# Patient Record
Sex: Female | Born: 1965 | Race: White | Hispanic: No | Marital: Married | Smoking: Never smoker
Health system: Southern US, Community
[De-identification: ages and names within clinical notes are randomized; demographics above are authoritative.]

## PROBLEM LIST (undated history)

## (undated) DIAGNOSIS — IMO0002 Reserved for concepts with insufficient information to code with codable children: Secondary | ICD-10-CM

## (undated) DIAGNOSIS — F32A Depression, unspecified: Secondary | ICD-10-CM

## (undated) DIAGNOSIS — F329 Major depressive disorder, single episode, unspecified: Secondary | ICD-10-CM

## (undated) HISTORY — DX: Reserved for concepts with insufficient information to code with codable children: IMO0002

## (undated) HISTORY — DX: Depression, unspecified: F32.A

## (undated) HISTORY — PX: CHOLECYSTECTOMY: SHX55

## (undated) HISTORY — DX: Major depressive disorder, single episode, unspecified: F32.9

## (undated) HISTORY — PX: AUGMENTATION MAMMAPLASTY: SUR837

## (undated) HISTORY — PX: BREAST ENHANCEMENT SURGERY: SHX7

## (undated) HISTORY — PX: HYSTEROSCOPY: SHX211

---

## 1999-04-10 ENCOUNTER — Other Ambulatory Visit: Admission: RE | Admit: 1999-04-10 | Discharge: 1999-04-10 | Payer: Self-pay | Admitting: *Deleted

## 2000-05-22 ENCOUNTER — Other Ambulatory Visit: Admission: RE | Admit: 2000-05-22 | Discharge: 2000-05-22 | Payer: Self-pay | Admitting: *Deleted

## 2002-08-05 ENCOUNTER — Other Ambulatory Visit: Admission: RE | Admit: 2002-08-05 | Discharge: 2002-08-05 | Payer: Self-pay | Admitting: Obstetrics and Gynecology

## 2003-12-29 ENCOUNTER — Other Ambulatory Visit: Admission: RE | Admit: 2003-12-29 | Discharge: 2003-12-29 | Payer: Self-pay | Admitting: Obstetrics and Gynecology

## 2008-03-24 HISTORY — PX: LAPAROSCOPIC OVARIAN CYSTECTOMY: SUR786

## 2008-04-13 ENCOUNTER — Ambulatory Visit (HOSPITAL_COMMUNITY): Admission: RE | Admit: 2008-04-13 | Discharge: 2008-04-13 | Payer: Self-pay | Admitting: Obstetrics and Gynecology

## 2008-04-13 ENCOUNTER — Encounter (INDEPENDENT_AMBULATORY_CARE_PROVIDER_SITE_OTHER): Payer: Self-pay | Admitting: Obstetrics and Gynecology

## 2008-05-24 ENCOUNTER — Encounter: Admission: RE | Admit: 2008-05-24 | Discharge: 2008-05-24 | Payer: Self-pay | Admitting: Obstetrics and Gynecology

## 2008-09-17 ENCOUNTER — Emergency Department (HOSPITAL_BASED_OUTPATIENT_CLINIC_OR_DEPARTMENT_OTHER): Admission: EM | Admit: 2008-09-17 | Discharge: 2008-09-17 | Payer: Self-pay | Admitting: Emergency Medicine

## 2008-09-19 ENCOUNTER — Ambulatory Visit: Payer: Self-pay | Admitting: Diagnostic Radiology

## 2008-09-19 ENCOUNTER — Ambulatory Visit (HOSPITAL_BASED_OUTPATIENT_CLINIC_OR_DEPARTMENT_OTHER): Admission: RE | Admit: 2008-09-19 | Discharge: 2008-09-19 | Payer: Self-pay | Admitting: Emergency Medicine

## 2009-04-11 ENCOUNTER — Ambulatory Visit: Payer: Self-pay | Admitting: *Deleted

## 2009-04-17 ENCOUNTER — Ambulatory Visit: Payer: Self-pay | Admitting: Family

## 2009-04-17 ENCOUNTER — Emergency Department (HOSPITAL_COMMUNITY): Admission: EM | Admit: 2009-04-17 | Discharge: 2009-04-17 | Payer: Self-pay | Admitting: Emergency Medicine

## 2009-04-17 ENCOUNTER — Observation Stay (HOSPITAL_COMMUNITY): Admission: AD | Admit: 2009-04-17 | Discharge: 2009-04-18 | Payer: Self-pay | Admitting: Psychiatry

## 2009-04-17 ENCOUNTER — Ambulatory Visit: Payer: Self-pay | Admitting: Psychiatry

## 2009-04-17 DIAGNOSIS — F329 Major depressive disorder, single episode, unspecified: Secondary | ICD-10-CM | POA: Insufficient documentation

## 2009-05-02 ENCOUNTER — Ambulatory Visit: Payer: Self-pay | Admitting: *Deleted

## 2009-06-05 ENCOUNTER — Telehealth: Payer: Self-pay | Admitting: Family

## 2009-06-15 ENCOUNTER — Ambulatory Visit: Payer: Self-pay | Admitting: *Deleted

## 2009-06-22 ENCOUNTER — Ambulatory Visit: Payer: Self-pay | Admitting: *Deleted

## 2009-07-11 ENCOUNTER — Ambulatory Visit: Payer: Self-pay | Admitting: *Deleted

## 2009-07-20 ENCOUNTER — Ambulatory Visit: Payer: Self-pay | Admitting: *Deleted

## 2009-08-28 ENCOUNTER — Encounter: Admission: RE | Admit: 2009-08-28 | Discharge: 2009-08-28 | Payer: Self-pay | Admitting: Obstetrics and Gynecology

## 2010-03-03 IMAGING — US US ABDOMEN COMPLETE
1 series · 14 of 25 positions shown · non-contrast
Comparison: None

CLINICAL DATA: Nausea, vomiting, diarrhea for a week

COMPLETE ABDOMINAL ULTRASOUND

[Series 1: us abdomen complete · 0.32mm/px · 14 of 77 slices shown]
[im 1/77]
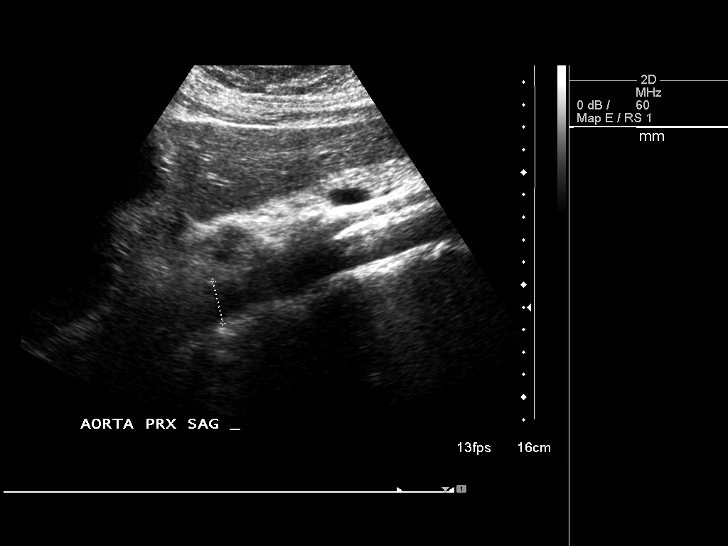
[im 7/77]
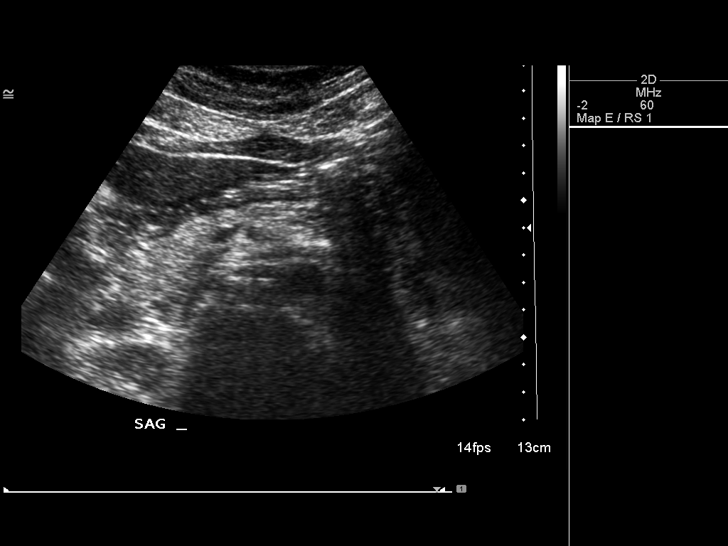
[im 13/77]
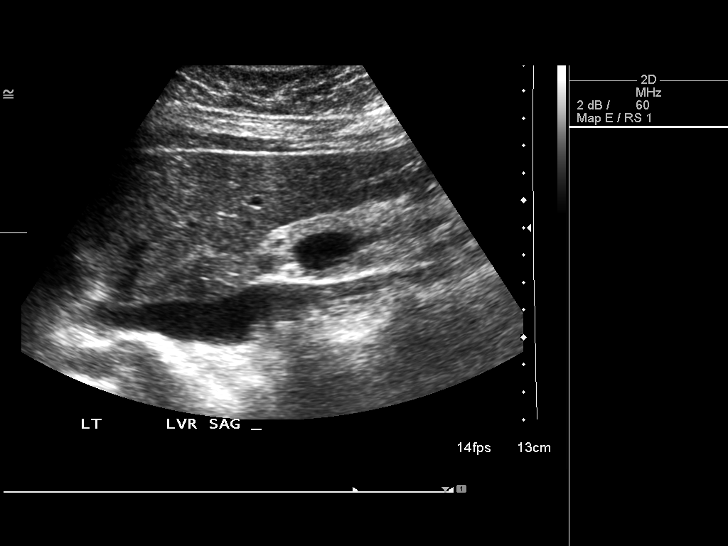
[im 20/77]
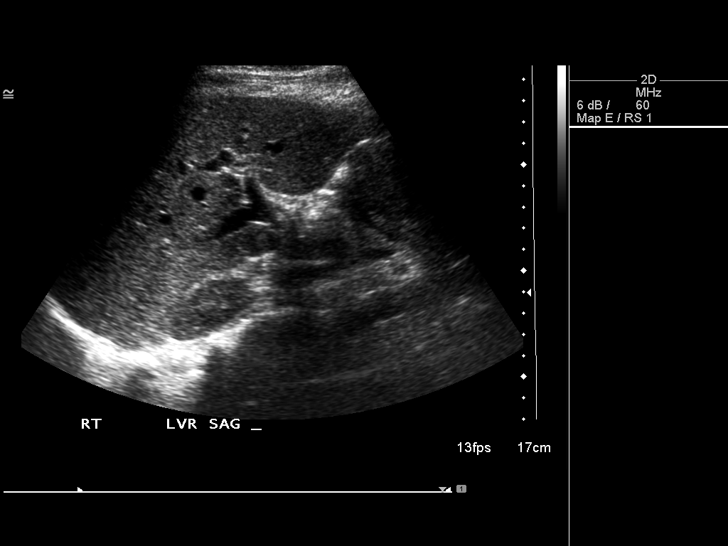
[im 26/77]
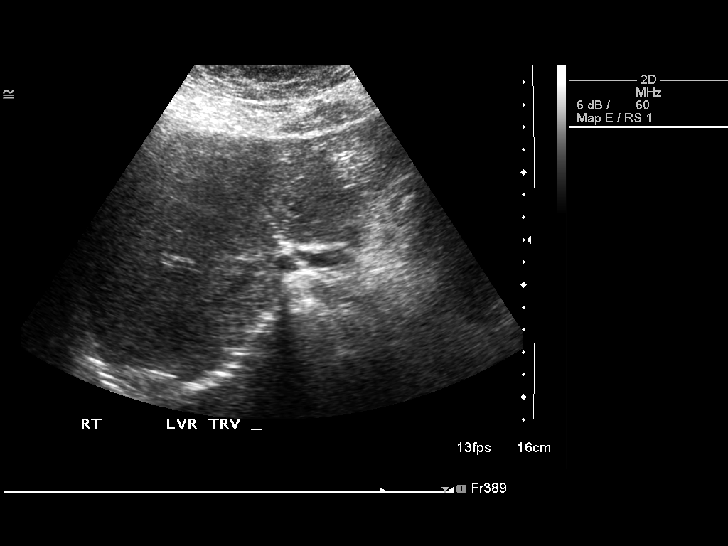
[im 29/77]
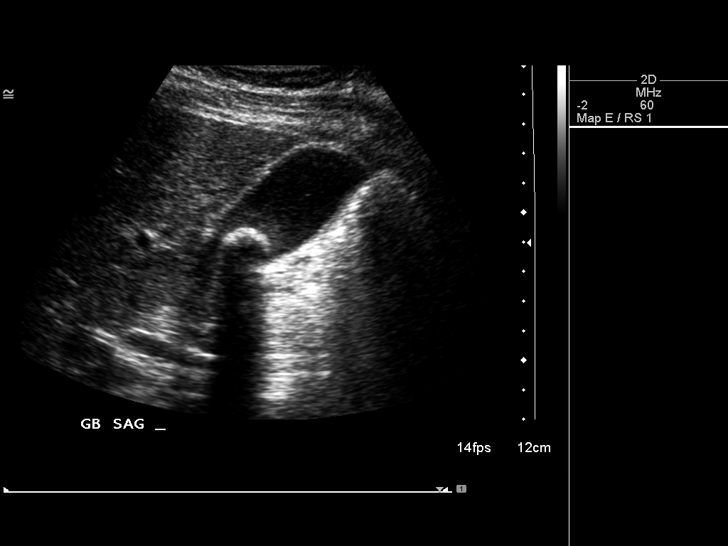
[im 35/77]
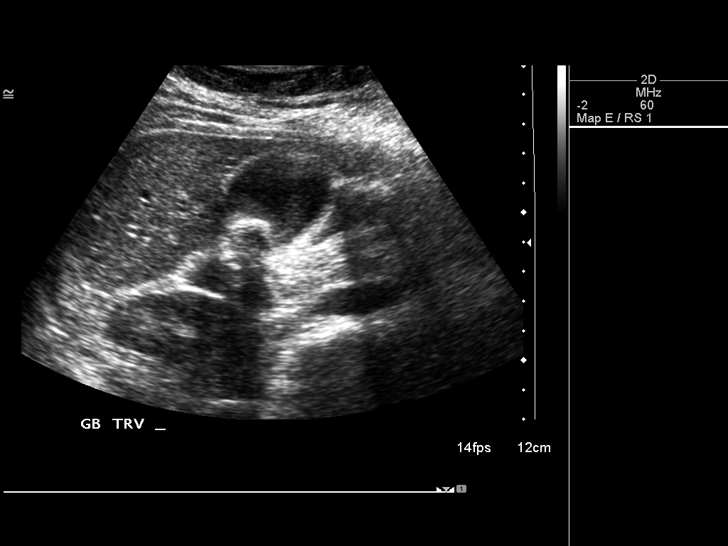
[im 42/77]
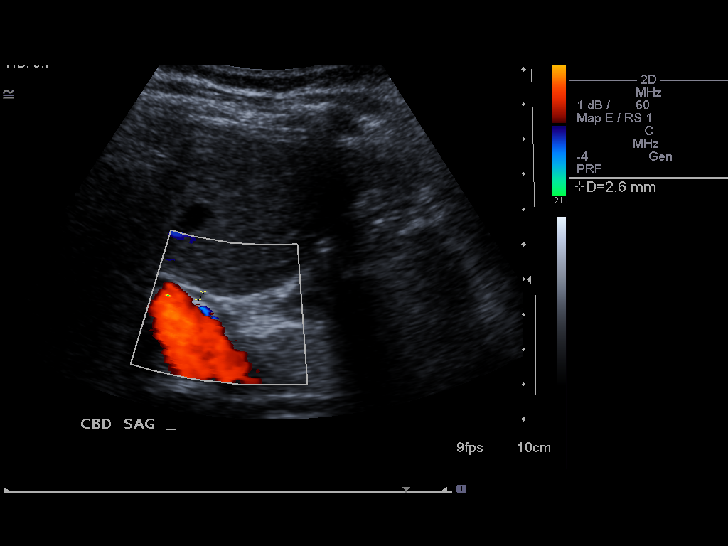
[im 48/77]
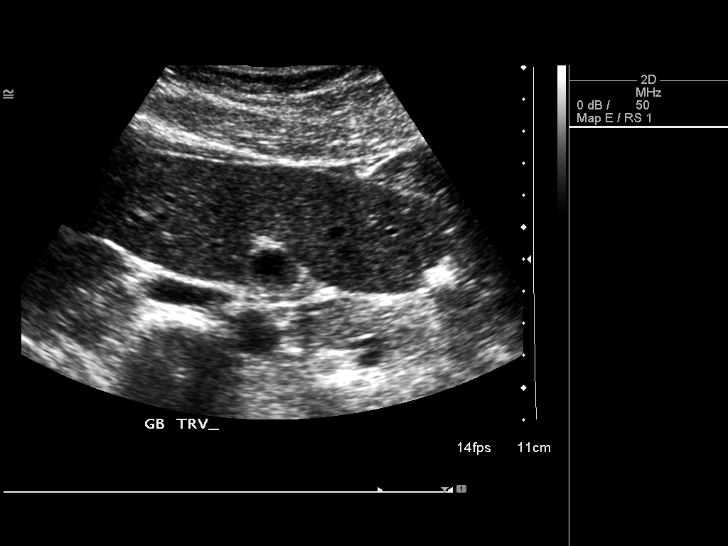
[im 51/77]
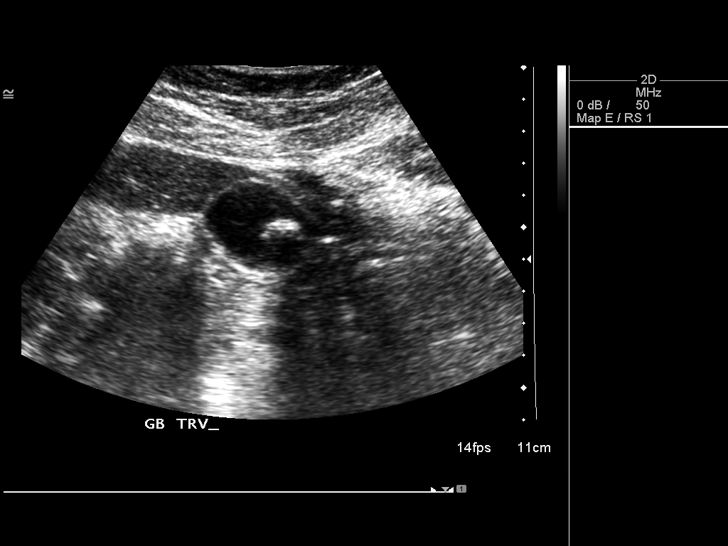
[im 58/77]
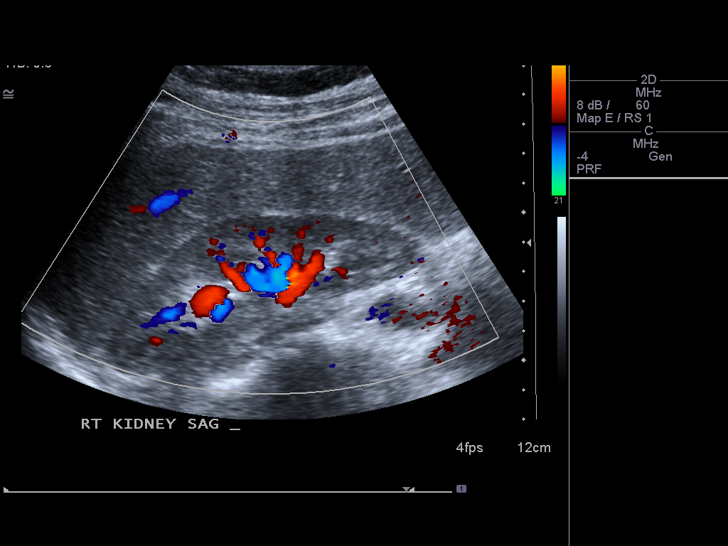
[im 64/77]
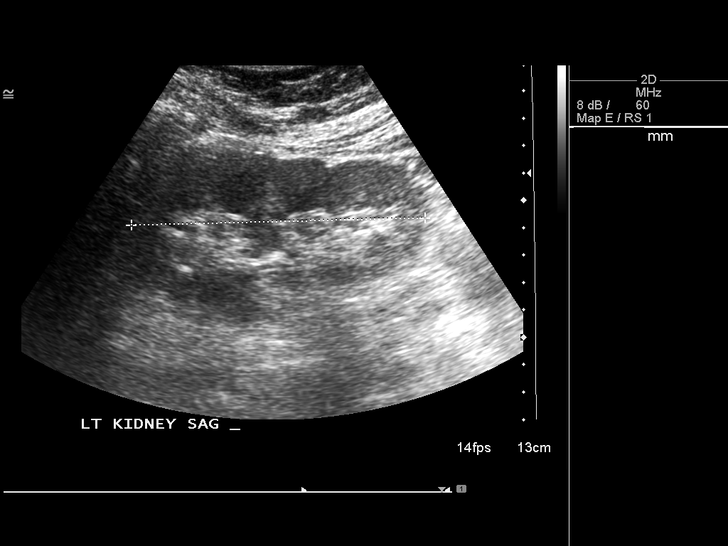
[im 70/77]
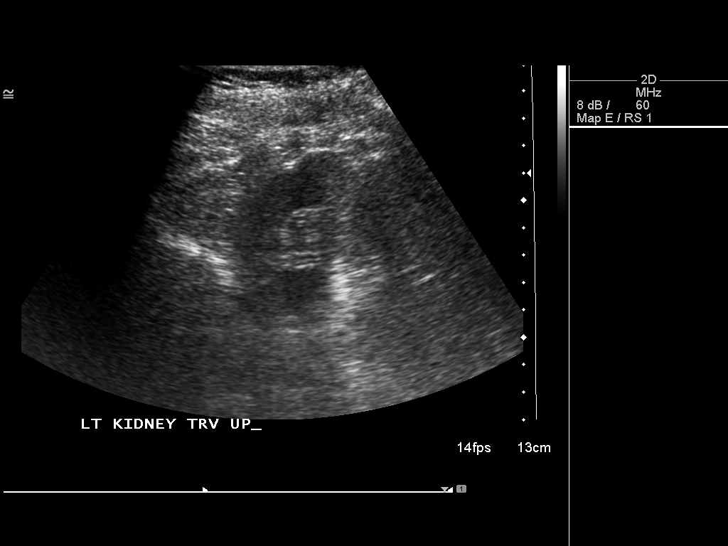
[im 77/77]
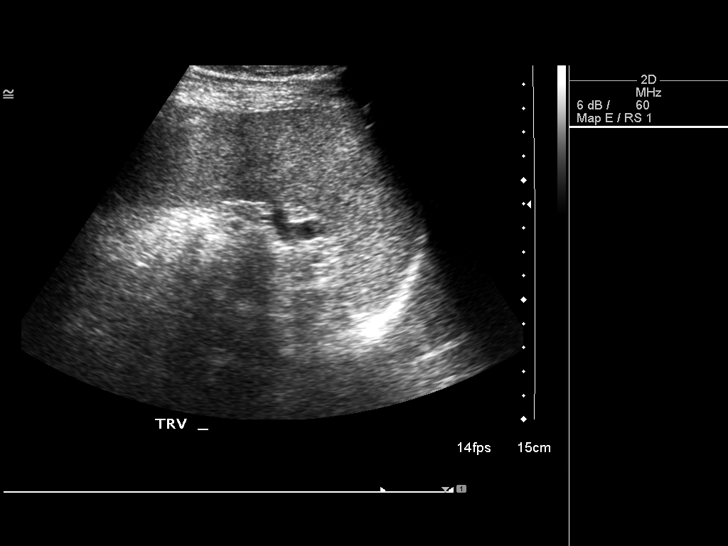

[14 of 25 positions shown; findings below may reference images not displayed]

FINDINGS: Gallbladder:  There is at least a single gallstone of 15 mm in
diameter with strong shadowing.  Some gallbladder sludge is
present.  No gallbladder wall thickening is noted and there is no
pain over the gallbladder upon compression.

Common bile duct:  The common bile duct is normal measuring 2.6 mm
in diameter.

Liver:  The liver has a normal echogenic pattern.  No ductal
dilatation is seen.

IVC:  Obscured by bowel gas.

Pancreas:  The pancreas is largely obscured by bowel gas.

Spleen:  The spleen is within upper limits of normal measuring
cm sagittally.

Right Kidney:  No hydronephrosis.  The right kidney measures
cm sagittally.

Left Kidney:  No hydronephrosis.  The left kidney measures 10.7 cm.

Abdominal aorta:  The aorta is largely obscured by bowel gas.
IMPRESSION: 1.  15 mm gallstone with some sludge.  No gallbladder wall
thickening or pain over the gallbladder.
2.  No ductal dilatation.
3.  Bowel gas obscures the pancreas and abdominal aorta.

## 2010-04-23 NOTE — Progress Notes (Signed)
  Phone Note Call from Patient   Caller: Mom Carollee Massed Details for Reason: Needs medication call in Summary of Call: Pt was in [facility]  in Jan 2011  given Zoloft 50 mg  wants  to know if you can call in rx     [facility]     Carollee Massed   [phone]  Initial call taken by: Darral Dash,  June 05, 2009 1:00 PM  Follow-up for Phone Call        I need to see patient back before I prescribe any medicines.  Pls call patient and arrange follow up.  Also, pls ask her if she was referred to an outpatient psychiatrist by behavioral health.   Follow-up by: Lemont Fillers FNP,  June 05, 2009 1:44 PM  Additional Follow-up for Phone Call Additional follow up Details #1::        Returned call to pt's mother, Kathie Rhodes. Advised her she would need appt. before we could fill any prescriptions.  Kathie Rhodes states that she will let pt. know. She states that pt. has appt to see Dr. Orland Jarred for medication management.  They will call back to schedule appt. if Dr. Orland Jarred cannot fill prescription. Additional Follow-up by: Mervin Kung CMA,  June 05, 2009 2:06 PM    Additional Follow-up for Phone Call Additional follow up Details #2:: Follow-up by: Lemont Fillers FNP,  June 05, 2009 3:48 PM

## 2010-04-23 NOTE — Letter (Signed)
Summary: Call a Nurse  Call a Nurse   Imported By: Lanelle Bal 04/25/2009 10:10:41  _____________________________________________________________________  External Attachment:    Type:   Image     Comment:   External Document

## 2010-04-23 NOTE — Assessment & Plan Note (Signed)
Summary: NEW PT/[facility]/NS/[facility]   Vital Signs:  Patient profile:   45 year old female Height:      67 inches Weight:      134.13 pounds BMI:     21.08 Pulse rate:   64 / minute BP sitting:   100 / 80  Vitals Entered By: Kandice Hams (April 17, 2009 8:55 AM) CC: new pt discuss insomnia and depression, want med   CC:  new pt discuss insomnia and depression and want med.  History of Present Illness: [name] is a  45 year old female who presents today to establish care.  She recently saw [name] due to depression.  Notes difficulty sleeping, eating, + tearfulness, crying spells.  Spends much of her time in the bed.  Notes that she lost her job March 2010 and  has been actively looking for a new job.  Notes that she and her husband are discussing separation.  She has 1 step son.  Patient has not taken any medicines for depression.  Mother who accompanies patient tells me that there is no family history of bipolar disorder and that patient has no former history of depression.    Preventive Screening-Counseling & Management  Alcohol-Tobacco     Alcohol drinks/day: 0     Smoking Status: never  Caffeine-Diet-Exercise     Caffeine use/day: no  Safety-Violence-Falls     Seat Belt Use: yes     Helmet Use: no     Firearms in the Home: firearms in the home      Drug Use:  no.    Allergies (verified): 1)  ! Codeine  Past History:  Past Surgical History: fibroid tumor removed 1/10 breast augmentation gallbladder 2010  Family History: Father: diabetes Mother:  Siblings:  Family History Diabetes 1st degree relative dad deceased pancreatic cancer  Social History: Marital Status: Married Children:  Occupation: unemployed Married Never Smoked Alcohol use-no Drug use-no Smoking Status:  never Caffeine use/day:  no Seat Belt Use:  yes Drug Use:  no  Review of Systems       Notes that she has + suicide ideation on a daily basis.  Notes that she has considered plan of  "taking a bunch of pills".  Also has occasional panic attacks.  Notes 25 pound weight loss since July 2010, + anorexia, +insomnia  Physical Exam  General:  Well-developed,well-nourished,in no acute distress; alert,appropriate and cooperative throughout examination Head:  Normocephalic and atraumatic without obvious abnormalities. No apparent alopecia or balding. Lungs:  Normal respiratory effort, chest expands symmetrically. Lungs are clear to auscultation, no crackles or wheezes. Heart:  Normal rate and regular rhythm. S1 and S2 normal without gallop, murmur, click, rub or other extra sounds. Abdomen:  Bowel sounds positive,abdomen soft and non-tender without masses, organomegaly or hernias noted. Psych:  Tearful, depressed affect.      Impression & Recommendations:  Problem # 1:  DEPRESSION, MAJOR (ICD-296.20) Assessment Deteriorated Patient with active suicide ideation.  Will send to [facility] ER for further evaluation and hopefully inpatient psychiatric evaluation.  I discussed plan with patient and mother.  They agree with plan- mother to take patient over to [facility].  Will plan a complete physical when patient is feeling better.  Patient Instructions: 1)  Please go directly to the [facility] Emergency Room. 2)  Arrange a follow up visit for a complete physical after your are discharged from the hospital.

## 2010-06-10 LAB — CBC
Hemoglobin: 11.5 g/dL — ABNORMAL LOW (ref 12.0–15.0)
MCHC: 33.8 g/dL (ref 30.0–36.0)
WBC: 3.7 10*3/uL — ABNORMAL LOW (ref 4.0–10.5)

## 2010-06-10 LAB — URINE MICROSCOPIC-ADD ON

## 2010-06-10 LAB — BASIC METABOLIC PANEL
GFR calc Af Amer: >60 mL/min (ref >60)
Potassium: 3.2 mEq/L — ABNORMAL LOW (ref 3.5–5.1)
Sodium: 136 mEq/L (ref 135–145)

## 2010-06-10 LAB — DIFFERENTIAL
Basophils Absolute: 0 10*3/uL (ref 0.0–0.1)
Eosinophils Absolute: 0 10*3/uL (ref 0.0–0.7)
Lymphocytes Relative: 30 % (ref 12–46)
Monocytes Relative: 9 % (ref 3–12)
Neutro Abs: 2.2 10*3/uL (ref 1.7–7.7)
Neutrophils Relative %: 59 % (ref 43–77)

## 2010-06-10 LAB — URINALYSIS, ROUTINE W REFLEX MICROSCOPIC
Nitrite: POSITIVE — AB
Protein, ur: >300 mg/dL — AB
Specific Gravity, Urine: 1.028 (ref 1.005–1.030)
Urobilinogen, UA: 1 mg/dL (ref 0.0–1.0)
pH: 5 (ref 5.0–8.0)

## 2010-06-10 LAB — RAPID URINE DRUG SCREEN, HOSP PERFORMED
Cocaine: NOT DETECTED
Opiates: NOT DETECTED
Tetrahydrocannabinol: NOT DETECTED

## 2010-07-01 LAB — DIFFERENTIAL
Basophils Absolute: 0 10*3/uL (ref 0.0–0.1)
Basophils Relative: 1 % (ref 0–1)
Eosinophils Relative: 2 % (ref 0–5)
Monocytes Absolute: 0.5 10*3/uL (ref 0.1–1.0)
Monocytes Relative: 10 % (ref 3–12)
Neutro Abs: 3.7 10*3/uL (ref 1.7–7.7)
Neutrophils Relative %: 66 % (ref 43–77)

## 2010-07-01 LAB — CBC
MCV: 83 fL (ref 78.0–100.0)
Platelets: 169 10*3/uL (ref 150–400)
WBC: 5.5 10*3/uL (ref 4.0–10.5)

## 2010-07-01 LAB — COMPREHENSIVE METABOLIC PANEL
ALT: 125 U/L — ABNORMAL HIGH (ref 0–35)
AST: 88 U/L — ABNORMAL HIGH (ref 0–37)
Alkaline Phosphatase: 94 U/L (ref 39–117)
Creatinine, Ser: 0.7 mg/dL (ref 0.4–1.2)
GFR calc Af Amer: >60 mL/min (ref >60)
GFR calc non Af Amer: >60 mL/min (ref >60)
Glucose, Bld: 82 mg/dL (ref 70–99)
Potassium: 3.4 mEq/L — ABNORMAL LOW (ref 3.5–5.1)
Total Protein: 7.1 g/dL (ref 6.0–8.3)

## 2010-07-01 LAB — URINALYSIS, ROUTINE W REFLEX MICROSCOPIC
Bilirubin Urine: NEGATIVE
Leukocytes, UA: NEGATIVE
Nitrite: NEGATIVE
Protein, ur: NEGATIVE mg/dL
pH: 6 (ref 5.0–8.0)

## 2010-07-01 LAB — URINE MICROSCOPIC-ADD ON

## 2010-07-01 LAB — LIPASE, BLOOD: Lipase: 65 U/L (ref 23–300)

## 2010-07-08 LAB — COMPREHENSIVE METABOLIC PANEL
Albumin: 4 g/dL (ref 3.5–5.2)
Alkaline Phosphatase: 77 U/L (ref 39–117)
CO2: 25 mEq/L (ref 19–32)
Chloride: 100 mEq/L (ref 96–112)
Creatinine, Ser: 0.62 mg/dL (ref 0.4–1.2)
Glucose, Bld: 80 mg/dL (ref 70–99)
Total Bilirubin: 0.4 mg/dL (ref 0.3–1.2)

## 2010-07-08 LAB — CA 125: CA 125: 4.3 U/mL (ref 0.0–30.2)

## 2010-07-08 LAB — CBC
HCT: 39 % (ref 36.0–46.0)
Hemoglobin: 13.2 g/dL (ref 12.0–15.0)
MCHC: 33.7 g/dL (ref 30.0–36.0)
WBC: 5.4 10*3/uL (ref 4.0–10.5)

## 2010-07-08 LAB — PREGNANCY, URINE: Preg Test, Ur: NEGATIVE

## 2010-07-24 ENCOUNTER — Other Ambulatory Visit: Payer: Self-pay | Admitting: Obstetrics and Gynecology

## 2010-07-24 DIAGNOSIS — Z1231 Encounter for screening mammogram for malignant neoplasm of breast: Secondary | ICD-10-CM

## 2010-08-06 NOTE — Op Note (Signed)
NAME:  Megan Whitaker, Megan Whitaker               ACCOUNT NO.:  [id number]   MEDICAL RECORD NO.:  [id number]          PATIENT TYPE:  AMB   LOCATION:  SDC                           FACILITY:  [facility]   PHYSICIAN:  Crist Fat. Rivard, M.D. DATE OF BIRTH:  [birth year]   DATE OF PROCEDURE:  DATE OF DISCHARGE:                               OPERATIVE REPORT   PREOPERATIVE DIAGNOSES:  Dysfunctional uterine bleeding with endometrial  mass and left ovarian mass.   POSTOPERATIVE DIAGNOSES:  Dysfunctional uterine bleeding, endometrial  polyp, bilateral ovarian cysts, and pelvic adhesions as well as probable  endometriosis.   PROCEDURE:  Hysteroscopy with resection of endometrial polyp and D and  C, laparoscopy with bilateral ovarian cystectomy, lysis of adhesions,  and peritoneal biopsy.   SURGEON:  Crist Fat. Rivard, MD   ASSISTANT:  Elmira J. Lowell Guitar, PA   ESTIMATED BLOOD LOSS:  Minimal.   PROCEDURE:  After being informed of the planned procedure with possible  complications including bleeding, infection, injury to other organs, and  the possibility of laparotomy and oophorectomy, informed consent was  obtained.  The patient was taken to OR #3, given general anesthesia with  endotracheal intubation without any complication.  She was placed in the  lithotomy position, prepped and draped in a sterile fashion, and a Foley  catheter was inserted in her bladder.  GYN exam revealed a retroverted  uterus, normal in size and shape and mobile, and a large right adnexa  measuring approximately 4-5 cm and a normal left adnexa.  A weighted  speculum was inserted.  Anterior lip of the cervix was grasped with a  tenaculum forceps, and we proceeded with a paracervical block using  Nesacaine 1%, 20 mL in the usual fashion.  Uterus was sounded at 7 cm,  and the cervix was easily dilated using Hegar dilator until #23.  This  allowed for easy entry of a diagnostic hysteroscope.  With sorbitol  perfusion at a maximum pressure  of 90 mmHg, we were able to visualize  the entire uterine cavity with both tubal ostia.  The cavity looked  completely normal except for a 1.5-cm lower posterior wall polyp.  Diagnostic hysteroscope was removed.  The cervix was dilated until #33  which now allowed easy entry of an operative hysteroscope and a single  loop resectoscope.  We proceeded with resection of the polyp.  We  removed all instruments and proceeded with a curettage of the  endometrial cavity to remove the remainder of the endometrial lining.  An acorn manipulator was then placed to pursue our procedure with  laparoscopy.  Water deficit at this time was 30 mL.   The patient was repositioned and redraped.  We infiltrated the umbilical  area with 3 mL of Marcaine 0.25 and we performed a 5-mm incision.  Veress needle was inserted.  Its placement was confirmed, and we  proceeded with insufflation of pneumoperitoneum using CO2 at a maximum  pressure of 15 mmHg.  A 5-mm trocar was inserted, and a 5-mm laparoscope  was inserted.  We infiltrated the suprapubic area with 3 mL of Marcaine  0.25 and performed a 5-mm incision for insertion of a 5-mm trocar under  direct visualization.  We proceeded in the exact same fashion in the  right lower quadrant.   OBSERVATION:  Anterior cul-de-sac is normal.  The uterus is normal.  Posterior cul-de-sac is normal.  The right tube is normal.  Right ovary  has a 4.5-cm simple-appearing cyst with a thin wall.  Both ovaries and  tubes were mobile.  There is no adhesion, and there is no outside  nodularity.  The left tube is normal.  The left ovary has a small cyst  measuring approximately 2 cm in diameter, also thin-wall appearing  simple.  The ovarian cortex appears completely normal.  There is no  adhesion, and there is no nodularity.  In the right ovarian fossa, we  note above the ureter a bluish lesion measuring 2-3 mm compatible with  endometriosis and right overlooking the ureter.  We  see another small 1-  2 mm brownish lesion that is compatible with endometriosis.  Appendix is  visualized and normal, although the cecal appendix junction is pulled  towards the right pelvic wall with a fine grade 1 to 2 adhesion.  Liver  is visualized and normal, and gallbladder is visualized and normal.   We decided to proceed with bilateral ovarian cystectomy, and we started  with the right ovary.  The utero-ovarian ligament was grasped, and the  ovary was immobilized.  Using vasopressin 20 units and 50 mL, we  infiltrate the capsule of the cyst and we opened it sharply with hot  scissors.  Using graspers, we can now peel the ovarian cyst wall away  from the ovarian capsule.  The cyst wall was removed entirely and sent  to Pathology.  We then irrigated profusely with warm saline, and we  completed hemostasis inside the ovarian resection area with bipolar  cauterization until hemostasis was satisfactory.  We then proceeded in  the exact same fashion on the left side, grasping the utero-ovarian  ligament.  We did not infiltrate, but use only scissors to open it.  The  ovarian cyst capsule was grasped, and we could bluntly dissect it away  with traction and countertraction.  It is removed and sent to Pathology.  We irrigated profusely with warm saline and complete hemostasis on the  cortex edges using bipolar cauterization.   We now proceeded with a peritoneal biopsy above the ureter with a biopsy  forceps.  This was sent separately and the area of biopsy was  cauterized.  We were unable to address the second lesion of  endometriosis since it lies completely over the ureter.  Finally, we  addressed the fine adhesions between the cecum and the appendix and the  pelvic wall with sharp dissection until it was completely freed.  One  area of bleeding was cauterized with bipolar cauterization.  We again  irrigated profusely with warm saline and we even did an underwater view  to confirm  satisfactory hemostasis on both ovaries on peritoneal biopsy  as well as on lysis of adhesion site.   The instruments were then removed.  Pneumoperitoneum was evacuated, and  all trocars were removed.  All trocar sites were closed with Dermabond.   Instruments and sponge count was complete x2.  Estimated blood loss was  minimal.  The procedure was very well tolerated by the patient who was  taken to recovery room and will be discharged home in a well and stable  condition.   SPECIMEN:  Right ovarian cyst wall, left ovarian cyst wall, peritoneal  biopsy, endometrial polyp, endometrial curettings, all sent to  Pathology.      Crist Fat Rivard, M.D.  Electronically Signed     SAR/MEDQ  D:  04/13/2008  T:  04/14/2008  Job:  [id number]

## 2010-09-02 ENCOUNTER — Ambulatory Visit
Admission: RE | Admit: 2010-09-02 | Discharge: 2010-09-02 | Disposition: A | Discharge status: Home / Self Care | Payer: 59 | Source: Ambulatory Visit | Attending: Obstetrics and Gynecology | Admitting: Obstetrics and Gynecology

## 2010-09-02 DIAGNOSIS — Z1231 Encounter for screening mammogram for malignant neoplasm of breast: Secondary | ICD-10-CM

## 2011-07-08 ENCOUNTER — Encounter: Payer: Self-pay | Admitting: Obstetrics and Gynecology

## 2011-07-08 ENCOUNTER — Ambulatory Visit (INDEPENDENT_AMBULATORY_CARE_PROVIDER_SITE_OTHER): Payer: 59 | Admitting: Obstetrics and Gynecology

## 2011-07-08 VITALS — BP 118/68 | Ht 67.0 in | Wt 143.0 lb

## 2011-07-08 DIAGNOSIS — N938 Other specified abnormal uterine and vaginal bleeding: Secondary | ICD-10-CM

## 2011-07-08 DIAGNOSIS — Z124 Encounter for screening for malignant neoplasm of cervix: Secondary | ICD-10-CM

## 2011-07-08 MED ORDER — NORETHINDRONE ACETATE 5 MG PO TABS: 10.0000 mg | ORAL_TABLET | Freq: Every day | ORAL | Status: DC

## 2011-07-08 MED ORDER — PROGESTERONE MICRONIZED 200 MG PO CAPS: 200.0000 mg | ORAL_CAPSULE | Freq: Every day | ORAL | Status: DC

## 2011-07-08 NOTE — Progress Notes (Signed)
Subjective:    Mariselda Badalamenti is a 46 y.o. female, G1P0010, who presents for an annual exam.     History   Social History  . Marital Status: Married    Spouse Name: N/A    Number of Children: N/A  . Years of Education: N/A   Occupational History  . receptionist    Social History Main Topics  . Smoking status: Never Smoker   . Smokeless tobacco: Never Used  . Alcohol Use: No  . Drug Use: No  . Sexually Active: Yes    Birth Control/ Protection: Condom   Other Topics Concern  . None   Social History Narrative  . None    Menstrual cycle:   LMP: Patient's last menstrual period was 06/07/2011.           Cycle: irregular occurring approximately every 50-60 days without intermenstrual spotting, usually lasting 14 to 30 days with light flow and no severe dysmenorrha  The following portions of the patient's history were reviewed and updated as appropriate: allergies, current medications, past family history, past medical history, past social history, past surgical history and problem list.  Review of Systems Pertinent items are noted in HPI. Breast:Negative for breast lump,nipple discharge or nipple retraction Gastrointestinal: Negative for abdominal pain, change in bowel habits or rectal bleeding Urinary:negative   Objective:    BP 118/68  Ht 5\' 7"  (1.702 m)  Wt 143 lb (64.864 kg)  BMI 22.40 kg/m2  LMP 06/07/2011    Weight:  Wt Readings from Last 1 Encounters:  07/08/11 143 lb (64.864 kg)          BMI: Body mass index is 22.40 kg/(m^2).  General Appearance: Alert, appropriate appearance for age. No acute distress HEENT: Grossly normal Neck / Thyroid: Supple, no masses, nodes or enlargement Lungs: clear to auscultation bilaterally Back: No CVA tenderness Breast Exam: No masses or nodes.No dimpling, nipple retraction or discharge. Cardiovascular: Regular rate and rhythm. S1, S2, no murmur Gastrointestinal: Soft, non-tender, no masses or organomegaly Pelvic Exam:  Vulva and vagina appear normal. Bimanual exam reveals normal uterus and adnexa.Retroverted uterus Rectovaginal: not indicated and normal rectal, no masses Lymphatic Exam: Non-palpable nodes in neck, clavicular, axillary, or inguinal regions Skin: no rash or abnormalities Neurologic: Normal gait and speech, no tremor  Psychiatric: Alert and oriented, appropriate affect.   Wet Prep:not applicable Urinalysis:not applicable UPT: Not done   Assessment:    Normal gyn exam Dysfunctional uterine bleeding    Plan:    mammogram pap smear return annually or prn TSH  FSH STD screening: declined Contraception:condoms      Therasa Lorenzi AMD

## 2011-07-08 NOTE — Progress Notes (Signed)
The patient reports:she complains of abnormal cycles still bleeding from march 16,2013  Last mammogram: was normal and not applicable June 2012 Last pap: was normal and not applicable April  2012  GC/Chlamydia cultures offered: declined HIV/RPR/HbsAg offered:  declined HSV 1 and 2 glycoprotein offered: declined  Menstrual cycle regular and monthly: No:  Menstrual flow normal: Yes  Urinary symptoms: none Normal bowel movements: Yes Reports abuse at home: no

## 2011-07-09 LAB — FOLLICLE STIMULATING HORMONE: FSH: 6.5 m[IU]/mL

## 2011-07-09 LAB — PAP IG W/ RFLX HPV ASCU

## 2011-09-03 ENCOUNTER — Telehealth: Payer: Self-pay | Admitting: Obstetrics and Gynecology

## 2011-09-03 ENCOUNTER — Other Ambulatory Visit: Payer: Self-pay | Admitting: Obstetrics and Gynecology

## 2011-09-03 DIAGNOSIS — Z1231 Encounter for screening mammogram for malignant neoplasm of breast: Secondary | ICD-10-CM

## 2011-09-03 DIAGNOSIS — Z9882 Breast implant status: Secondary | ICD-10-CM

## 2011-09-03 NOTE — Telephone Encounter (Signed)
sr pt 

## 2011-09-04 NOTE — Telephone Encounter (Signed)
LM for pt that SR is unavailable for the next week.  Pt can make appt with EP or any other provider if she doesn't want to wait.  Pt to call the office to sch appt.  ld

## 2011-09-10 ENCOUNTER — Encounter: Payer: Self-pay | Admitting: Obstetrics and Gynecology

## 2011-09-10 ENCOUNTER — Ambulatory Visit (INDEPENDENT_AMBULATORY_CARE_PROVIDER_SITE_OTHER): Payer: 59 | Admitting: Obstetrics and Gynecology

## 2011-09-10 VITALS — BP 100/60 | HR 72 | Wt 144.0 lb

## 2011-09-10 DIAGNOSIS — L98499 Non-pressure chronic ulcer of skin of other sites with unspecified severity: Secondary | ICD-10-CM

## 2011-09-10 DIAGNOSIS — F329 Major depressive disorder, single episode, unspecified: Secondary | ICD-10-CM

## 2011-09-10 DIAGNOSIS — N951 Menopausal and female climacteric states: Secondary | ICD-10-CM

## 2011-09-10 DIAGNOSIS — IMO0002 Reserved for concepts with insufficient information to code with codable children: Secondary | ICD-10-CM

## 2011-09-10 DIAGNOSIS — N912 Amenorrhea, unspecified: Secondary | ICD-10-CM

## 2011-09-10 LAB — POCT URINE PREGNANCY: Preg Test, Ur: NEGATIVE

## 2011-09-10 NOTE — Progress Notes (Signed)
Menopausal symptoms:anxiety, decreased libido, depression, hot flashes,vaginal dryness  The patient is not taking hormone replacement therapy The patient  is not taking a Calcium supplement. The patient participates in regular exercise: no. Post-menopausal bleeding:no  The patient is sexually active.  Last Pap: was normal April  2013 Last mammogram: was normal June  2012 Last DEXA scan : T= no    History of DVT/PE: No Family history of breast cancer: No Family history of endometrial cancer:No

## 2011-09-10 NOTE — Patient Instructions (Addendum)
Begin Progesterone 200 mg 2 tablets at bedtime x 10 days  Once you begin to bleed, continue the Progesterone but start the Lo LoestrinFe

## 2011-09-10 NOTE — Progress Notes (Signed)
46 YO previously bled from March 16-April 26 (only stopped after being given medication to do so) presents with complaints of severe hot flashes for the past 3 weeks.  TSH was normal at last visit and FSH was not menopausal.  Since patient has not had a menstrual period since July 19, 2011 so began Progesterone 200 mg 6 days ago but has not bled yet.   A: Vasomotor Symptoms     Anovulatory Bleeding  P: Increase Progesterone to 400 mg qhs x 10 days      Reviewed management options for vasomotor symptoms     herbal,, hormonal and miscellaneous      Patient to try birth control pills ( in the past have made her     nauseated) to try Lo Loestrin Fe sample given      Patient to start Lo Loestrin on the first day of bleeding and     complete the Progesterone.      RTO-AEx or prn     Brochure on [place] given  [employer], PA-C

## 2011-09-30 ENCOUNTER — Encounter: Payer: 59 | Admitting: Obstetrics and Gynecology

## 2011-10-01 ENCOUNTER — Ambulatory Visit
Admission: RE | Admit: 2011-10-01 | Discharge: 2011-10-01 | Disposition: A | Discharge status: Home / Self Care | Payer: 59 | Source: Ambulatory Visit | Attending: Obstetrics and Gynecology | Admitting: Obstetrics and Gynecology

## 2011-10-01 DIAGNOSIS — Z9882 Breast implant status: Secondary | ICD-10-CM

## 2011-10-01 DIAGNOSIS — Z1231 Encounter for screening mammogram for malignant neoplasm of breast: Secondary | ICD-10-CM

## 2011-10-02 ENCOUNTER — Telehealth: Payer: Self-pay | Admitting: Obstetrics and Gynecology

## 2011-10-02 ENCOUNTER — Other Ambulatory Visit: Payer: Self-pay

## 2011-10-02 NOTE — Telephone Encounter (Signed)
PT REQUESTING FOR LOLOESTRIN;BUT I DON'T SEE RX. CAN PT HAVE THIS RX

## 2011-10-02 NOTE — Telephone Encounter (Signed)
TRIAGE/RX °

## 2011-10-02 NOTE — Telephone Encounter (Signed)
Patient see in June 2013 for oligomenorrhea and vasomotor symptoms and was given management options (herbal, hormonal and misc.) to include observation, however, she chose to begin a sample of Lo Loestrin.  May have a prescription for Lo Loestrin #1 1 po qd with 11 refills.  May also be given a discount card if desirable. Chart to triage to manage.  Herny Scurlock, PA-C

## 2011-10-03 ENCOUNTER — Telehealth: Payer: Self-pay

## 2011-10-03 NOTE — Telephone Encounter (Signed)
TC TO PT REGARDING REQUEST FOR B/C. PER EP INFORMED PT THAT I WILL CALL LO LOESTRIN #1 1 PO QD WITH 11 REFILLS TO PT PHARMACY. PT VOICED UNDERSTANDING.

## 2011-11-13 ENCOUNTER — Telehealth: Payer: Self-pay | Admitting: Obstetrics and Gynecology

## 2011-11-13 NOTE — Telephone Encounter (Signed)
Linda/Ep/addressing issue

## 2011-11-13 NOTE — Telephone Encounter (Signed)
EP,THIS PT STATES THAT SHE STILL HAS NOT HAD A PERIOD YET AND SHE IS TAKING LOLOESTRIN PILLS. WHAT IS YOUR THOUGHTS ON WHAT TO DO NEXT. NO CYCLE SINCE June.

## 2011-11-17 NOTE — Telephone Encounter (Signed)
TC TO PT REGARDING LOLOESTRIN 24 PILLS. PER EP INFORMED PT THAT IT IS NOT UNUSUAL TO MISS CYCLES ON LOW DOSE B/C PILLS ,HOWEVER FOR PT REASSURANCE, SHE CAN TAKE A PREGNANCY TEST IF SHE HAS MISSED PILLS OR HAS BEEN MORE THAN 4 HOURS LATE OF TAKING PILLS AT THE SAME TIME EVERYDAY. PT VOICED UNDERSTANDING.

## 2011-11-17 NOTE — Telephone Encounter (Signed)
Patient with perimenopausal bleeding on Lo-Loestrin 24 complaining of amenorrhea.  Patient may do a pregnancy test for reassurance (if she's missed pills or has been more than 4 hours late taking her pills at the usual time) however, it is not unusual to miss periods on low dose pills.  Cardarius Senat, PA-C

## 2012-01-06 ENCOUNTER — Other Ambulatory Visit: Payer: Self-pay | Admitting: Obstetrics and Gynecology

## 2012-01-13 ENCOUNTER — Telehealth: Payer: Self-pay | Admitting: Obstetrics and Gynecology

## 2012-01-13 NOTE — Telephone Encounter (Signed)
VM from pt. Needs RF Progesterone  PT [id number]  Or [phone]

## 2012-01-14 NOTE — Telephone Encounter (Signed)
Spoke with pt rgd msg informed per SR need eval for irreg bleeding pt has appt 01/21/12 at 8:30 with SR pt voice understanding

## 2012-01-14 NOTE — Telephone Encounter (Signed)
Lm on vm tcb rgd msg 

## 2012-01-14 NOTE — Telephone Encounter (Signed)
Needs to be seen

## 2012-01-14 NOTE — Telephone Encounter (Signed)
Lm on vm tcb rgd previous call per SR pt needs eval for irreg bleeding

## 2012-01-14 NOTE — Telephone Encounter (Signed)
Spoke with pt rgd msg pt wants refill on progesterone pt states was put on Loestrin it helped with bleeding for a while but last pack of pills cycle came a week early and still having some spotting advised pt will consult with SR to see if refill is appropriate or if eval is needed for irreg bleeding pt voice understanding

## 2012-01-21 ENCOUNTER — Ambulatory Visit (INDEPENDENT_AMBULATORY_CARE_PROVIDER_SITE_OTHER): Payer: 59 | Admitting: Obstetrics and Gynecology

## 2012-01-21 ENCOUNTER — Encounter: Payer: Self-pay | Admitting: Obstetrics and Gynecology

## 2012-01-21 VITALS — BP 112/64 | Temp 98.5°F | Ht 67.25 in | Wt 141.0 lb

## 2012-01-21 DIAGNOSIS — N926 Irregular menstruation, unspecified: Secondary | ICD-10-CM

## 2012-01-21 LAB — FOLLICLE STIMULATING HORMONE: FSH: 44.2 m[IU]/mL

## 2012-01-21 LAB — POCT URINE PREGNANCY: Preg Test, Ur: NEGATIVE

## 2012-01-21 MED ORDER — NORETHINDRONE ACET-ETHINYL EST 1-20 MG-MCG PO TABS: 1.0000 | ORAL_TABLET | Freq: Every day | ORAL | Status: AC

## 2012-01-21 NOTE — Progress Notes (Signed)
Subjective:     Megan Whitaker is a 46 y.o. woman,G1P0010, who presents for irregular menses.S/P HSC/polypectomy 2009   Current contraception: OCP (estrogen/progesterone).LoLoestrin since June 2013 to regulate cycle but still taking Prometrium daily Hormone replacement therapy:  Prometrium 200 mg daily New medication: No  History of WYO:VZCH  History of infertility: no. History of abnormal Pap smear: yes  History of fibroids: Yes   Increased stress: Yes   Abnormal bleeding pattern started: Pt states that her cycles have been irregular for the last couple years. Was placed on BC pill in April 2013 to help to regulate cycle. States that it is not seeming to regulate. Pt states that she had a cycle 01/26, 03/16, 04/28, 06/30, 09/15, 10/07. States that sometimes her cycle may last any where from 1 day - 2 weeks. Not any heavier than normal, no unusual cramping during cycle.    Bleeding pattern: either irregular with skipping cycles or bleeding for as long as 30 days for the last 3 years  Denies any urinary tract symptoms, changes in bowel movements, nausea, vomiting or fever. No abdominal pain.  The following portions of the patient's history were reviewed and updated as appropriate: allergies, current medications, past family history, past medical history, past social history and past surgical history.  Review of Systems Pertinent items are noted in HPI.    Objective:    There were no vitals taken for this visit.  Weight:  Wt Readings from Last 1 Encounters:  09/10/11 144 lb (65.318 kg)    BMI: There is no height or weight on file to calculate BMI.  General Appearance: Alert, appropriate appearance for age. No acute distress HEENT: Grossly normal Neck / Thyroid: Supple, no masses, nodes or enlargement Lungs: clear to auscultation bilaterally Back: No CVA tenderness Cardiovascular: Regular rate and rhythm. S1, S2, no murmur Gastrointestinal: Soft, non-tender, no masses or  organomegaly Pelvic Exam: Vulva and vagina appear normal. Bimanual exam reveals normal uterus and adnexa. Rectovaginal: not indicated      Assessment:   Dysfunctional uterine bleeding 2nd to using LoLoestrin with Prometrium daily   Plan:   FSH, ultrasound, D/C Prometrium and change to Loestrin 1/20  Silverio Lay MD

## 2012-02-05 ENCOUNTER — Ambulatory Visit (INDEPENDENT_AMBULATORY_CARE_PROVIDER_SITE_OTHER): Payer: 59 | Admitting: Obstetrics and Gynecology

## 2012-02-05 ENCOUNTER — Other Ambulatory Visit: Payer: Self-pay | Admitting: Obstetrics and Gynecology

## 2012-02-05 ENCOUNTER — Encounter: Payer: Self-pay | Admitting: Obstetrics and Gynecology

## 2012-02-05 ENCOUNTER — Ambulatory Visit (INDEPENDENT_AMBULATORY_CARE_PROVIDER_SITE_OTHER): Payer: 59

## 2012-02-05 VITALS — BP 106/60 | Ht 67.0 in | Wt 145.0 lb

## 2012-02-05 DIAGNOSIS — N938 Other specified abnormal uterine and vaginal bleeding: Secondary | ICD-10-CM

## 2012-02-05 DIAGNOSIS — N949 Unspecified condition associated with female genital organs and menstrual cycle: Secondary | ICD-10-CM

## 2012-02-05 DIAGNOSIS — N926 Irregular menstruation, unspecified: Secondary | ICD-10-CM

## 2012-02-05 MED ORDER — PROGESTERONE MICRONIZED 200 MG PO CAPS: 200.0000 mg | ORAL_CAPSULE | Freq: Every day | ORAL | Status: DC

## 2012-02-05 NOTE — Progress Notes (Signed)
Subjective:    Megan Whitaker is a 46 y.o. female, G1P0010, who presents for Gyn ultrasound because of DUB for 2.5 years.  The following portions of the patient's history were reviewed and updated as appropriate: allergies, current medications, past family history.  Objective:    BP 106/60  Ht 5\' 7"  (1.702 m)  Wt 145 lb (65.772 kg)  BMI 22.71 kg/m2  LMP 12/29/2011    Weight:  Wt Readings from Last 1 Encounters:  02/05/12 145 lb (65.772 kg)          BMI: Body mass index is 22.71 kg/(m^2).  ULTRASOUND: Uterus normal    Adnexa normal    Endometrium 3.5 mm    Free fluid: no    Other findings:  n/a  FSH: 44.2  Today is Day 38   Assessment:    Dysfunctional uterine bleeding Perimenopausal   Plan:     Continue with Loestrin Follow-up AEX 06/2012

## 2012-02-05 NOTE — Progress Notes (Deleted)
Subjective:    Bronwyn Belasco is a 46 y.o. female, G1P0010, who presents for Gyn ultrasound because of irregular bleeding.  The following portions of the patient's history were reviewed and updated as appropriate: allergies, current medications, past family history.  Objective:    LMP 12/29/2011    Weight:  Wt Readings from Last 1 Encounters:  01/21/12 141 lb (63.957 kg)          BMI: There is no height or weight on file to calculate BMI.  ULTRASOUND: Uterus Retroflexed    Adnexa Normal    Endometrium 0.350 cm    Free fluid: small amount of CDS fluid WNL    Other findings:  Unremarkable urinary bladder. No uterine abnormality seen. Thin endometrium. Ovaries/adnexa  WNL    Assessment:    {diagnoses; exam gyn:13148}    Plan:    ***  Silverio Lay MD

## 2012-09-01 ENCOUNTER — Other Ambulatory Visit: Payer: Self-pay

## 2012-09-01 DIAGNOSIS — Z1231 Encounter for screening mammogram for malignant neoplasm of breast: Secondary | ICD-10-CM

## 2012-10-05 ENCOUNTER — Ambulatory Visit
Admission: RE | Admit: 2012-10-05 | Discharge: 2012-10-05 | Disposition: A | Discharge status: Home / Self Care | Payer: 59 | Source: Ambulatory Visit

## 2012-10-05 DIAGNOSIS — Z1231 Encounter for screening mammogram for malignant neoplasm of breast: Secondary | ICD-10-CM

## 2013-09-28 ENCOUNTER — Other Ambulatory Visit: Payer: Self-pay

## 2013-09-28 DIAGNOSIS — Z1231 Encounter for screening mammogram for malignant neoplasm of breast: Secondary | ICD-10-CM

## 2013-10-07 ENCOUNTER — Ambulatory Visit
Admission: RE | Admit: 2013-10-07 | Discharge: 2013-10-07 | Disposition: A | Discharge status: Home / Self Care | Payer: 59 | Source: Ambulatory Visit

## 2013-10-07 ENCOUNTER — Encounter (INDEPENDENT_AMBULATORY_CARE_PROVIDER_SITE_OTHER): Payer: Self-pay

## 2013-10-07 DIAGNOSIS — Z1231 Encounter for screening mammogram for malignant neoplasm of breast: Secondary | ICD-10-CM

## 2014-01-23 ENCOUNTER — Encounter: Payer: Self-pay | Admitting: Obstetrics and Gynecology

## 2014-09-29 ENCOUNTER — Other Ambulatory Visit: Payer: Self-pay

## 2014-09-29 DIAGNOSIS — Z1231 Encounter for screening mammogram for malignant neoplasm of breast: Secondary | ICD-10-CM

## 2014-10-11 ENCOUNTER — Ambulatory Visit
Admission: RE | Admit: 2014-10-11 | Discharge: 2014-10-11 | Disposition: A | Discharge status: Home / Self Care | Payer: 59 | Source: Ambulatory Visit

## 2014-10-11 DIAGNOSIS — Z1231 Encounter for screening mammogram for malignant neoplasm of breast: Secondary | ICD-10-CM

## 2014-10-13 ENCOUNTER — Other Ambulatory Visit: Payer: Self-pay | Admitting: Obstetrics and Gynecology

## 2014-10-13 DIAGNOSIS — R928 Other abnormal and inconclusive findings on diagnostic imaging of breast: Secondary | ICD-10-CM

## 2014-10-19 ENCOUNTER — Ambulatory Visit
Admission: RE | Admit: 2014-10-19 | Discharge: 2014-10-19 | Disposition: A | Discharge status: Home / Self Care | Payer: 59 | Source: Ambulatory Visit | Attending: Obstetrics and Gynecology | Admitting: Obstetrics and Gynecology

## 2014-10-19 DIAGNOSIS — R928 Other abnormal and inconclusive findings on diagnostic imaging of breast: Secondary | ICD-10-CM

## 2015-10-02 ENCOUNTER — Other Ambulatory Visit: Payer: Self-pay | Admitting: Obstetrics and Gynecology

## 2015-10-02 DIAGNOSIS — Z9882 Breast implant status: Secondary | ICD-10-CM

## 2015-10-02 DIAGNOSIS — Z1231 Encounter for screening mammogram for malignant neoplasm of breast: Secondary | ICD-10-CM

## 2015-10-12 ENCOUNTER — Ambulatory Visit
Admission: RE | Admit: 2015-10-12 | Discharge: 2015-10-12 | Disposition: A | Discharge status: Home / Self Care | Payer: PRIVATE HEALTH INSURANCE | Source: Ambulatory Visit | Attending: Obstetrics and Gynecology | Admitting: Obstetrics and Gynecology

## 2015-10-12 DIAGNOSIS — Z1231 Encounter for screening mammogram for malignant neoplasm of breast: Secondary | ICD-10-CM

## 2015-10-12 DIAGNOSIS — Z9882 Breast implant status: Secondary | ICD-10-CM

## 2016-01-23 ENCOUNTER — Other Ambulatory Visit: Payer: Self-pay | Admitting: Obstetrics and Gynecology

## 2016-10-13 ENCOUNTER — Other Ambulatory Visit: Payer: Self-pay | Admitting: Obstetrics and Gynecology

## 2016-10-13 DIAGNOSIS — Z1231 Encounter for screening mammogram for malignant neoplasm of breast: Secondary | ICD-10-CM

## 2016-10-21 ENCOUNTER — Ambulatory Visit
Admission: RE | Admit: 2016-10-21 | Discharge: 2016-10-21 | Disposition: A | Discharge status: Home / Self Care | Payer: PRIVATE HEALTH INSURANCE | Source: Ambulatory Visit | Attending: Obstetrics and Gynecology | Admitting: Obstetrics and Gynecology

## 2016-10-21 DIAGNOSIS — Z1231 Encounter for screening mammogram for malignant neoplasm of breast: Secondary | ICD-10-CM

## 2017-11-19 ENCOUNTER — Other Ambulatory Visit: Payer: Self-pay | Admitting: Obstetrics and Gynecology

## 2017-11-19 DIAGNOSIS — Z1231 Encounter for screening mammogram for malignant neoplasm of breast: Secondary | ICD-10-CM

## 2017-12-15 ENCOUNTER — Ambulatory Visit
Admission: RE | Admit: 2017-12-15 | Discharge: 2017-12-15 | Disposition: A | Discharge status: Home / Self Care | Payer: PRIVATE HEALTH INSURANCE | Source: Ambulatory Visit | Attending: Obstetrics and Gynecology | Admitting: Obstetrics and Gynecology

## 2017-12-15 DIAGNOSIS — Z1231 Encounter for screening mammogram for malignant neoplasm of breast: Secondary | ICD-10-CM

## 2019-02-14 ENCOUNTER — Other Ambulatory Visit: Payer: Self-pay | Admitting: Obstetrics and Gynecology

## 2019-02-14 DIAGNOSIS — Z1231 Encounter for screening mammogram for malignant neoplasm of breast: Secondary | ICD-10-CM

## 2019-04-08 ENCOUNTER — Ambulatory Visit
Admission: RE | Admit: 2019-04-08 | Discharge: 2019-04-08 | Disposition: A | Discharge status: Home / Self Care | Payer: No Typology Code available for payment source | Source: Ambulatory Visit | Attending: Obstetrics and Gynecology | Admitting: Obstetrics and Gynecology

## 2019-04-08 ENCOUNTER — Other Ambulatory Visit: Payer: Self-pay

## 2019-04-08 DIAGNOSIS — Z1231 Encounter for screening mammogram for malignant neoplasm of breast: Secondary | ICD-10-CM

## 2019-04-13 ENCOUNTER — Other Ambulatory Visit: Payer: Self-pay | Admitting: Obstetrics and Gynecology

## 2019-04-13 DIAGNOSIS — R928 Other abnormal and inconclusive findings on diagnostic imaging of breast: Secondary | ICD-10-CM

## 2019-04-15 ENCOUNTER — Other Ambulatory Visit (HOSPITAL_COMMUNITY): Payer: Self-pay | Admitting: *Deleted

## 2019-04-15 DIAGNOSIS — N6320 Unspecified lump in the left breast, unspecified quadrant: Secondary | ICD-10-CM

## 2019-04-28 ENCOUNTER — Ambulatory Visit (HOSPITAL_COMMUNITY)
Admission: RE | Admit: 2019-04-28 | Discharge: 2019-04-28 | Disposition: A | Discharge status: Home / Self Care | Payer: No Typology Code available for payment source | Source: Ambulatory Visit | Attending: Obstetrics and Gynecology | Admitting: Obstetrics and Gynecology

## 2019-04-28 ENCOUNTER — Encounter (HOSPITAL_COMMUNITY): Payer: Self-pay

## 2019-04-28 ENCOUNTER — Other Ambulatory Visit: Payer: Self-pay

## 2019-04-28 DIAGNOSIS — Z1239 Encounter for other screening for malignant neoplasm of breast: Secondary | ICD-10-CM

## 2019-04-28 NOTE — Patient Instructions (Signed)
Explained breast self awareness with Charolotte Capuchin. Let patient know [facility] will cover Pap smears every 3 years or Pap smear and HPV typing every 5 years unless has a history of abnormal Pap smears. Patient to call [facility] when hears back from her OBGYN on when her last Pap smear was completed. Referred patient to the [facility] for a a left breast diagnostic mammogram and possible ultrasound per recommendation. Appointment scheduled for Friday, April 29, 2019 at 0910. Patient aware of appointment and will be there. Charolotte Capuchin verbalized understanding.  Kandice Schmelter, Kathaleen Maser, RN 8:27 PM

## 2019-04-28 NOTE — Progress Notes (Signed)
Patient referred to [facility] by the [facility] due to recommending additional imaging of the left breast. Screening mammogram completed 04/08/2019.  Pap Smear: Pap smear not completed today. Patient unsure when her last Pap smear was completed. Patient stated she goes to her OBGYN Dr. Estanislado Pandy yearly and not sure when her last Pap smear was completed. Patient stated she has attempted to call her OBGYN's office to confirm the date of her last Pap smear but has not heard back. Patient stated she will let us know when gets confirmation of her last Pap smear and schedule with [facility] if has been greater than 3 years or 5 years with HPV typing. Per patient has no history of an abnormal Pap smear. Last Pap smear result in Epic is from 07/08/2011.  Physical exam: Breasts Breasts symmetrical. No skin abnormalities bilateral breasts. No nipple retraction bilateral breasts. No nipple discharge bilateral breasts. No lymphadenopathy. No lumps palpated bilateral breasts. No complaints of pain or tenderness on exam. Referred patient to the [facility] for a a left breast diagnostic mammogram and possible ultrasound per recommendation. Appointment scheduled for Friday, April 29, 2019 at 0910.        Pelvic/Bimanual No Pap smear completed today since patient unsure when last Pap smear was completed and patient states goes to OBGYN yearly. See above note for details.  Smoking History: Patient has never smoked.  Patient Navigation: Patient education provided. Access to services provided for patient through [facility] program.   Colorectal Cancer Screening: Per patient had a colonoscopy completed in 2019. No complaints today.  Breast and Cervical Cancer Risk Assessment: Patient has no family history of breast cancer, known genetic mutations, or radiation treatment to the chest before age 30. Patient has no history of cervical dysplasia, immunocompromised, or DES exposure in-utero.  Risk  Assessment    Risk Scores      04/28/2019   Last edited by: Narda Rutherford, LPN   5-year risk: 1.2 %   Lifetime risk: 9.6 %

## 2019-04-29 ENCOUNTER — Ambulatory Visit
Admission: RE | Admit: 2019-04-29 | Discharge: 2019-04-29 | Disposition: A | Discharge status: Home / Self Care | Payer: No Typology Code available for payment source | Source: Ambulatory Visit | Attending: Obstetrics and Gynecology | Admitting: Obstetrics and Gynecology

## 2019-04-29 DIAGNOSIS — N6320 Unspecified lump in the left breast, unspecified quadrant: Secondary | ICD-10-CM

## 2019-06-24 ENCOUNTER — Ambulatory Visit: Payer: Self-pay | Attending: Internal Medicine

## 2019-06-24 DIAGNOSIS — Z23 Encounter for immunization: Secondary | ICD-10-CM

## 2019-06-24 NOTE — Progress Notes (Signed)
   Covid-19 Vaccination Clinic  Name:  CHERLY ERNO    MRN: [id number] DOB: March 06, 1966  06/24/2019  Ms. Bohan was observed post Covid-19 immunization for 15 minutes without incident. She was provided with Vaccine Information Sheet and instruction to access the V-Safe system.   Ms. Demo was instructed to call 911 with any severe reactions post vaccine: Marland Kitchen Difficulty breathing  . Swelling of face and throat  . A fast heartbeat  . A bad rash all over body  . Dizziness and weakness   Immunizations Administered    Name Date Dose VIS Date Route   Pfizer COVID-19 Vaccine 06/24/2019  8:22 AM 0.3 mL 03/04/2019 Intramuscular   Manufacturer: [employer], [employer]   Lot: [id number]   NDC: [address]-0

## 2019-07-19 ENCOUNTER — Ambulatory Visit: Payer: No Typology Code available for payment source | Attending: Internal Medicine

## 2019-07-19 DIAGNOSIS — Z23 Encounter for immunization: Secondary | ICD-10-CM

## 2019-07-19 NOTE — Progress Notes (Signed)
   Covid-19 Vaccination Clinic  Name:  Megan Whitaker    MRN: [id number] DOB: [birth year]  07/19/2019  [name] was observed post Covid-19 immunization for 15 minutes without incident. She was provided with Vaccine Information Sheet and instruction to access the V-Safe system.   [name] was instructed to call 911 with any severe reactions post vaccine: Marland Kitchen Difficulty breathing  . Swelling of face and throat  . A fast heartbeat  . A bad rash all over body  . Dizziness and weakness   Immunizations Administered    Name Date Dose VIS Date Route   Pfizer COVID-19 Vaccine 07/19/2019  8:36 AM 0.3 mL 05/18/2018 Intramuscular   Manufacturer: [employer], [employer]   Lot: [id number]   NDC: [address]-2

## 2020-10-10 IMAGING — MG MM DIGITAL DIAGNOSTIC UNILAT*L* W/ TOMO W/ CAD
8 series · 9 of 24 positions shown · non-contrast
Comparison: Previous exam(s).

CLINICAL DATA: The patient was called back for a left breast
asymmetry.

EXAM:
DIGITAL DIAGNOSTIC LEFT MAMMOGRAM WITH IMPLANTS AND TOMO
ULTRASOUND LEFT BREAST
The patient has retropectoral implants. Standard and implant
displaced views were performed.

[L MLO synth-2D (1 of 2)]
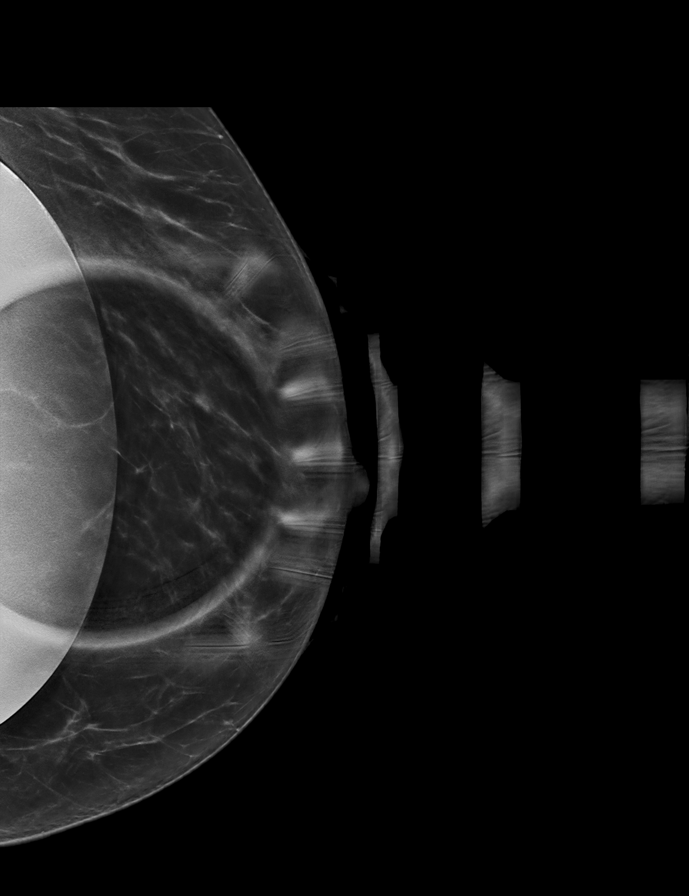

[L MLO synth-2D (2 of 2)]
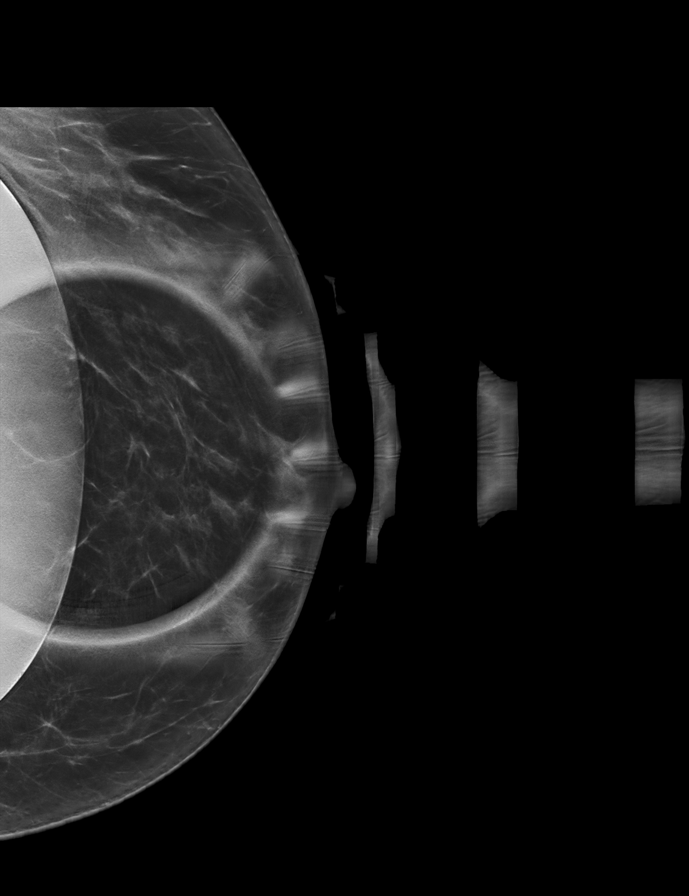

[L ML synth-2D]
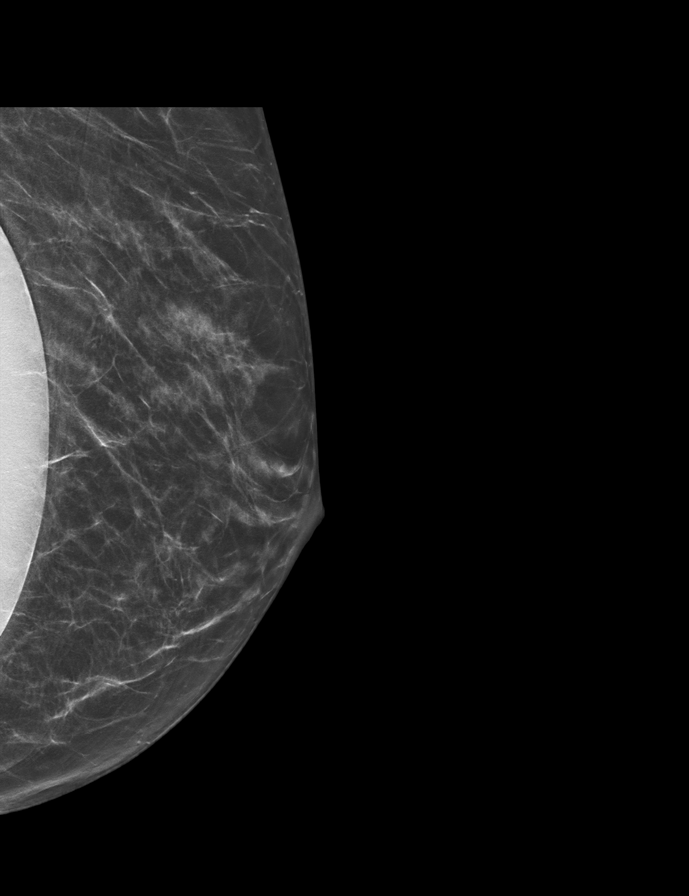

[L CC synth-2D]
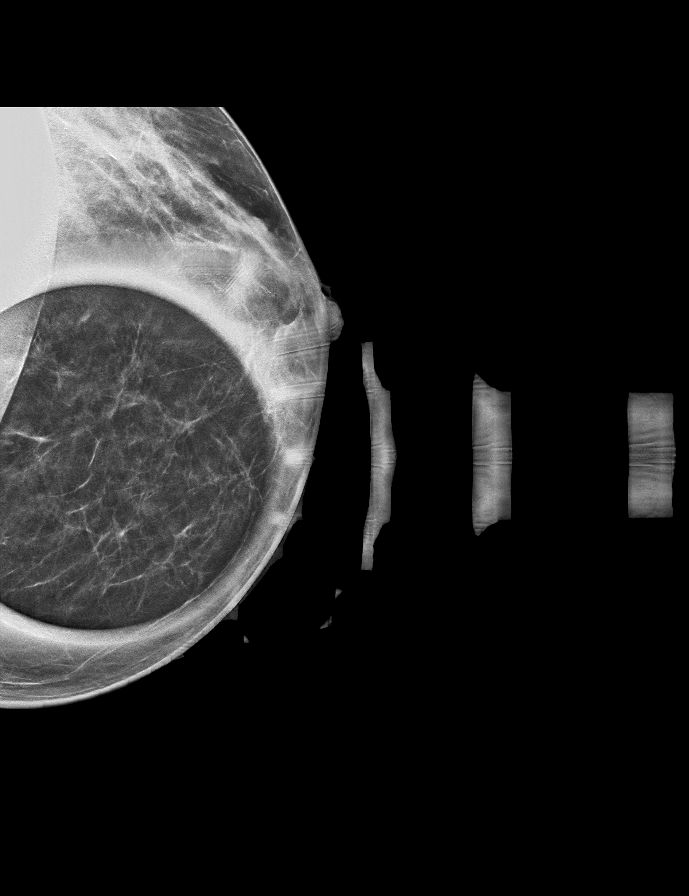

[L ML tomo · 2 of 59 frames shown]
[frame 20/59]
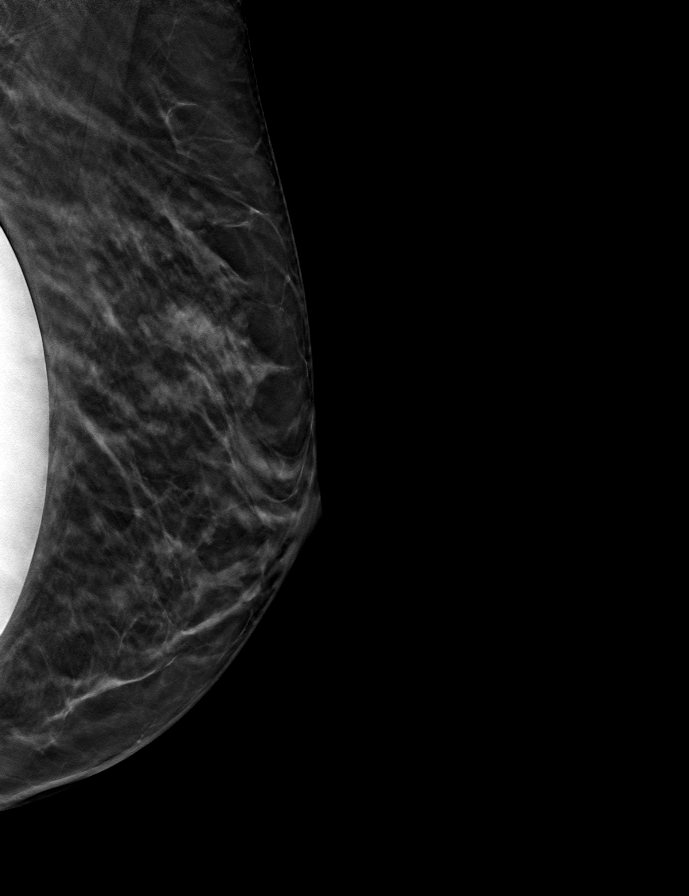
[frame 30/59]
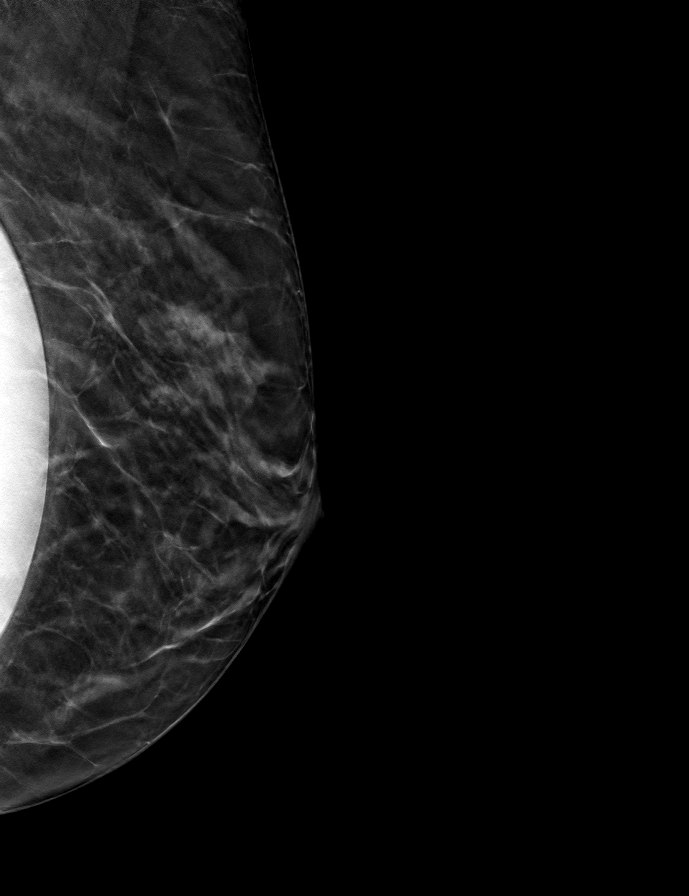

[L MLO tomo (1 of 2) · tomo slice 27/54.0]
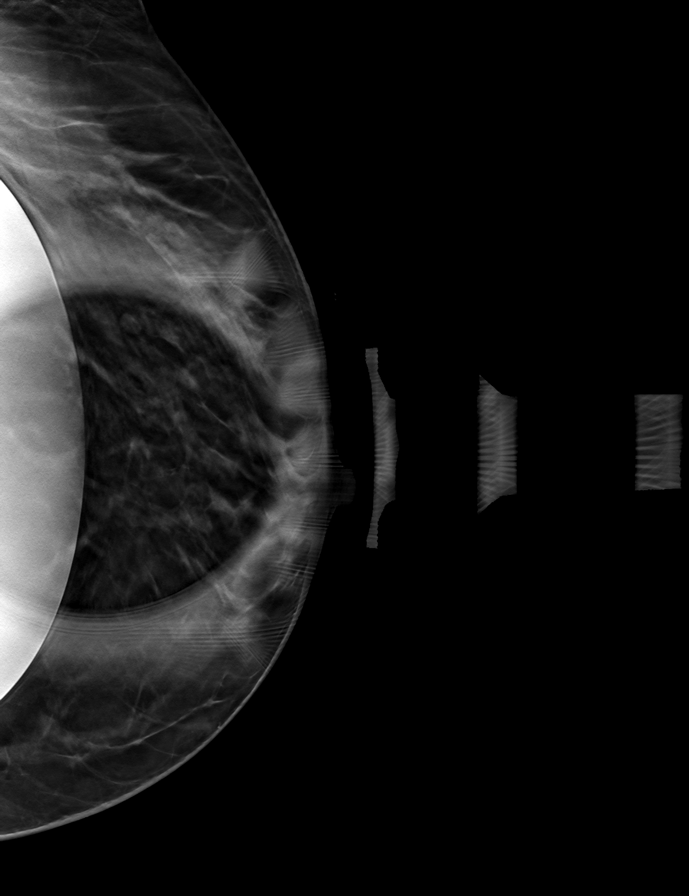

[L CC tomo · tomo slice 24/47.0]
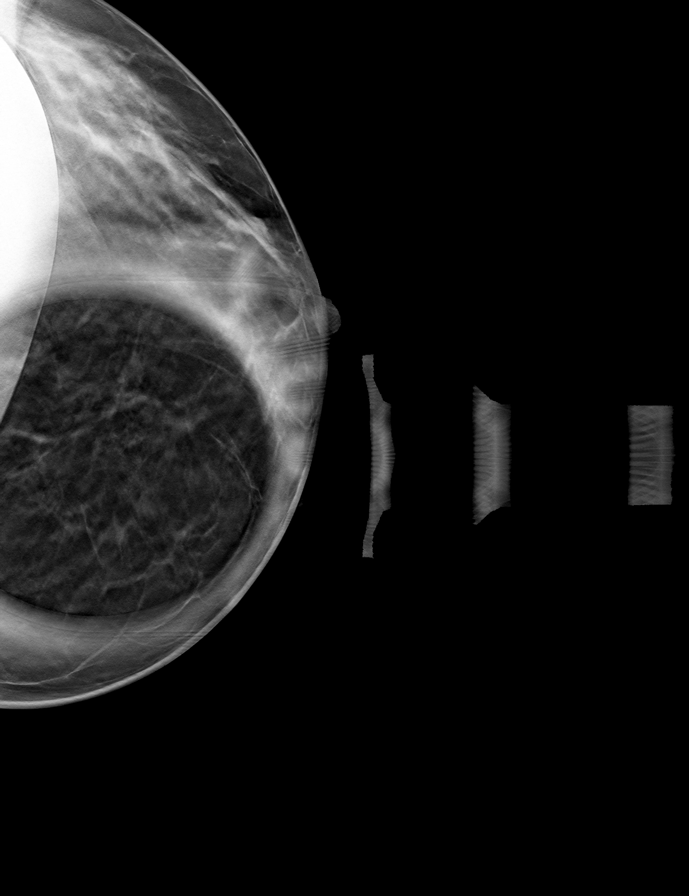

[L MLO tomo (2 of 2) · tomo slice 29/57.0]
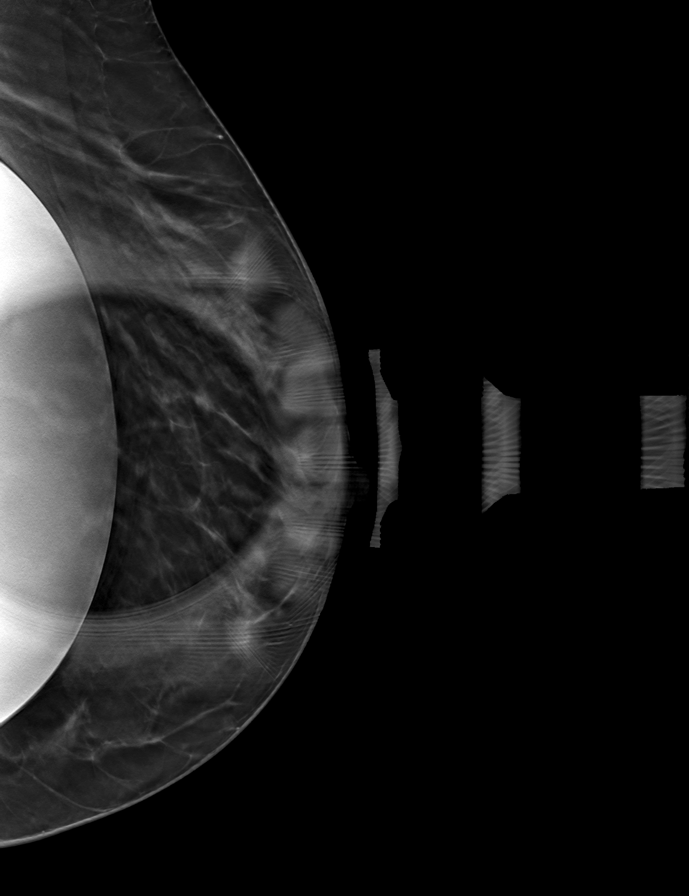

[9 of 24 positions shown; findings below may reference images not displayed]

ACR Breast Density Category b: There are scattered areas of
fibroglandular density.
FINDINGS: The asymmetry in the left breast resolves on today's imaging.

On physical exam, no suspicious lumps are identified.

Targeted ultrasound is performed, showing no sonographic correlate
for the left breast asymmetry.
IMPRESSION: No mammographic or sonographic evidence of malignancy.

RECOMMENDATION:
Annual screening mammography.

I have discussed the findings and recommendations with the patient.
If applicable, a reminder letter will be sent to the patient
regarding the next appointment.

BI-RADS CATEGORY  1: Negative.
